# Patient Record
Sex: Female | Born: 1960 | Race: Black or African American | Hispanic: No | Marital: Married | Smoking: Current every day smoker
Health system: Southern US, Community
[De-identification: ages and names within clinical notes are randomized; demographics above are authoritative.]

## PROBLEM LIST (undated history)

## (undated) DIAGNOSIS — J45909 Unspecified asthma, uncomplicated: Secondary | ICD-10-CM

## (undated) DIAGNOSIS — D649 Anemia, unspecified: Secondary | ICD-10-CM

## (undated) DIAGNOSIS — F419 Anxiety disorder, unspecified: Secondary | ICD-10-CM

---

## 2015-10-18 ENCOUNTER — Encounter (HOSPITAL_COMMUNITY): Payer: Self-pay | Admitting: Emergency Medicine

## 2015-10-18 ENCOUNTER — Emergency Department (HOSPITAL_COMMUNITY): Payer: Self-pay

## 2015-10-18 ENCOUNTER — Emergency Department (HOSPITAL_COMMUNITY)
Admission: EM | Admit: 2015-10-18 | Discharge: 2015-10-19 | Disposition: A | Discharge status: Home / Self Care | Payer: Self-pay | Attending: Physician Assistant | Admitting: Physician Assistant

## 2015-10-18 DIAGNOSIS — Y9389 Activity, other specified: Secondary | ICD-10-CM | POA: Insufficient documentation

## 2015-10-18 DIAGNOSIS — S90511A Abrasion, right ankle, initial encounter: Secondary | ICD-10-CM | POA: Insufficient documentation

## 2015-10-18 DIAGNOSIS — Y999 Unspecified external cause status: Secondary | ICD-10-CM | POA: Insufficient documentation

## 2015-10-18 DIAGNOSIS — S199XXA Unspecified injury of neck, initial encounter: Secondary | ICD-10-CM | POA: Insufficient documentation

## 2015-10-18 DIAGNOSIS — S1980XA Other specified injuries of unspecified part of neck, initial encounter: Secondary | ICD-10-CM

## 2015-10-18 DIAGNOSIS — R937 Abnormal findings on diagnostic imaging of other parts of musculoskeletal system: Secondary | ICD-10-CM

## 2015-10-18 DIAGNOSIS — R51 Headache: Secondary | ICD-10-CM | POA: Insufficient documentation

## 2015-10-18 DIAGNOSIS — R52 Pain, unspecified: Secondary | ICD-10-CM

## 2015-10-18 DIAGNOSIS — M25531 Pain in right wrist: Secondary | ICD-10-CM | POA: Insufficient documentation

## 2015-10-18 DIAGNOSIS — M25521 Pain in right elbow: Secondary | ICD-10-CM | POA: Insufficient documentation

## 2015-10-18 DIAGNOSIS — J45909 Unspecified asthma, uncomplicated: Secondary | ICD-10-CM | POA: Insufficient documentation

## 2015-10-18 DIAGNOSIS — Y9289 Other specified places as the place of occurrence of the external cause: Secondary | ICD-10-CM | POA: Insufficient documentation

## 2015-10-18 DIAGNOSIS — D321 Benign neoplasm of spinal meninges: Secondary | ICD-10-CM | POA: Insufficient documentation

## 2015-10-18 DIAGNOSIS — F17210 Nicotine dependence, cigarettes, uncomplicated: Secondary | ICD-10-CM | POA: Insufficient documentation

## 2015-10-18 HISTORY — DX: Unspecified asthma, uncomplicated: J45.909

## 2015-10-18 HISTORY — DX: Anxiety disorder, unspecified: F41.9

## 2015-10-18 MED ORDER — OXYCODONE-ACETAMINOPHEN 5-325 MG PO TABS
2.0000 | ORAL_TABLET | Freq: Once | ORAL | Status: AC
  Administered 2015-10-18: 2 via ORAL
  Filled 2015-10-18: qty 2

## 2015-10-18 MED ORDER — TETANUS-DIPHTH-ACELL PERTUSSIS 5-2.5-18.5 LF-MCG/0.5 IM SUSP
0.5000 mL | Freq: Once | INTRAMUSCULAR | Status: DC
  Filled 2015-10-18: qty 0.5

## 2015-10-18 MED ORDER — OXYCODONE-ACETAMINOPHEN 5-325 MG PO TABS: 1.0000 | ORAL_TABLET | Freq: Four times a day (QID) | ORAL | 0 refills | Status: DC | PRN

## 2015-10-18 MED ORDER — OXYCODONE-ACETAMINOPHEN 5-325 MG PO TABS
1.0000 | ORAL_TABLET | Freq: Once | ORAL | Status: AC
  Administered 2015-10-18: 1 via ORAL
  Filled 2015-10-18: qty 1

## 2015-10-18 MED ORDER — SODIUM CHLORIDE 0.9 % IV BOLUS (SEPSIS)
1000.0000 mL | Freq: Once | INTRAVENOUS | Status: AC
  Administered 2015-10-18: 1000 mL via INTRAVENOUS

## 2015-10-18 MED ORDER — MORPHINE SULFATE (PF) 2 MG/ML IV SOLN
2.0000 mg | Freq: Once | INTRAVENOUS | Status: AC
  Administered 2015-10-18: 2 mg via INTRAVENOUS
  Filled 2015-10-18: qty 1

## 2015-10-18 MED ORDER — IOPAMIDOL (ISOVUE-370) INJECTION 76%
INTRAVENOUS | Status: AC
  Filled 2015-10-18: qty 50

## 2015-10-18 MED ORDER — GADOBENATE DIMEGLUMINE 529 MG/ML IV SOLN
14.0000 mL | Freq: Once | INTRAVENOUS | Status: AC | PRN
  Administered 2015-10-18: 14 mL via INTRAVENOUS

## 2015-10-18 NOTE — ED Notes (Signed)
Patient transported to MRI 
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
## 2015-10-18 NOTE — ED Notes (Addendum)
Patient states she "feels faint and feels like she's going to pass out."  Patient BP 73/50, EDP notified.
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
## 2015-10-18 NOTE — ED Notes (Signed)
Patient given crackers and water with ice, per Jerene Pitch, Therapist, sports.
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
## 2015-10-18 NOTE — ED Notes (Signed)
Pt given more crackers, per Jerene Pitch, Therapist, sports.

## 2015-10-18 NOTE — Progress Notes (Signed)
Orthopedic Tech Progress Note Patient Details:  Melody Rojas 1960-12-28 [id number]  Ortho Devices Type of Ortho Device: Sling immobilizer Ortho Device/Splint Location: RUE Ortho Device/Splint Interventions: Ordered, Application   Braulio Bosch 10/18/2015, 4:12 PM

## 2015-10-18 NOTE — ED Notes (Signed)
Pt back in room. No distress observed. 

## 2015-10-18 NOTE — Discharge Instructions (Signed)
You have a fracture in your right arm. Wear the sling at all times and follow up with the ortho physician given.
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
## 2015-10-18 NOTE — ED Notes (Signed)
MD at bedside. 
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
## 2015-10-18 NOTE — ED Notes (Signed)
Pt's BP 80/59.  Informed Brooke, Therapist, sports.
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
## 2015-11-04 ENCOUNTER — Other Ambulatory Visit: Payer: Self-pay | Admitting: Orthopedic Surgery

## 2015-11-04 ENCOUNTER — Other Ambulatory Visit (HOSPITAL_COMMUNITY): Payer: Self-pay | Admitting: Orthopedic Surgery

## 2015-11-04 DIAGNOSIS — M25511 Pain in right shoulder: Secondary | ICD-10-CM

## 2015-11-07 ENCOUNTER — Ambulatory Visit (HOSPITAL_BASED_OUTPATIENT_CLINIC_OR_DEPARTMENT_OTHER): Payer: Self-pay

## 2015-12-13 ENCOUNTER — Ambulatory Visit: Payer: Self-pay | Attending: Physical Therapy | Admitting: Physical Therapy

## 2015-12-22 ENCOUNTER — Ambulatory Visit (INDEPENDENT_AMBULATORY_CARE_PROVIDER_SITE_OTHER): Payer: Self-pay | Admitting: Orthopedic Surgery

## 2015-12-26 ENCOUNTER — Ambulatory Visit (INDEPENDENT_AMBULATORY_CARE_PROVIDER_SITE_OTHER): Payer: Self-pay | Admitting: Orthopedic Surgery

## 2015-12-26 DIAGNOSIS — S42291D Other displaced fracture of upper end of right humerus, subsequent encounter for fracture with routine healing: Secondary | ICD-10-CM

## 2015-12-29 ENCOUNTER — Ambulatory Visit: Payer: Self-pay | Admitting: Neurology

## 2016-01-10 ENCOUNTER — Telehealth (INDEPENDENT_AMBULATORY_CARE_PROVIDER_SITE_OTHER): Payer: Self-pay
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
## 2016-01-16 NOTE — Telephone Encounter (Signed)
Per Junie Panning we are going to discuss when pt comes in for her follow up appt.

## 2016-01-17 NOTE — Telephone Encounter (Signed)
Set reminder in chart for the date of appt here in office to discuss.

## 2016-01-23 ENCOUNTER — Ambulatory Visit (INDEPENDENT_AMBULATORY_CARE_PROVIDER_SITE_OTHER): Payer: Self-pay | Admitting: Orthopedic Surgery

## 2016-01-23 ENCOUNTER — Telehealth (INDEPENDENT_AMBULATORY_CARE_PROVIDER_SITE_OTHER): Payer: Self-pay

## 2016-01-30 NOTE — Telephone Encounter (Signed)
Will sign off on message as pt did not keep appt and does not return calls

## 2016-03-01 ENCOUNTER — Telehealth (INDEPENDENT_AMBULATORY_CARE_PROVIDER_SITE_OTHER): Payer: Self-pay
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
## 2016-07-18 ENCOUNTER — Emergency Department (HOSPITAL_COMMUNITY)
Admission: EM | Admit: 2016-07-18 | Discharge: 2016-07-19 | Disposition: A | Discharge status: Home / Self Care | Payer: Self-pay | Attending: Emergency Medicine | Admitting: Emergency Medicine

## 2016-07-18 ENCOUNTER — Encounter (HOSPITAL_COMMUNITY): Payer: Self-pay

## 2016-07-18 DIAGNOSIS — J45909 Unspecified asthma, uncomplicated: Secondary | ICD-10-CM | POA: Insufficient documentation

## 2016-07-18 DIAGNOSIS — F17210 Nicotine dependence, cigarettes, uncomplicated: Secondary | ICD-10-CM | POA: Insufficient documentation

## 2016-07-18 DIAGNOSIS — Z79899 Other long term (current) drug therapy: Secondary | ICD-10-CM | POA: Insufficient documentation

## 2016-07-18 DIAGNOSIS — K047 Periapical abscess without sinus: Secondary | ICD-10-CM | POA: Insufficient documentation

## 2016-07-18 HISTORY — DX: Anemia, unspecified: D64.9

## 2016-07-18 NOTE — ED Triage Notes (Signed)
Pt has dental abscess on upper L side of mouth, pt has several missing teeth and several broken teeth, swelling to L cheek, pt reports pain shoots up to L eye.

## 2016-07-19 LAB — BASIC METABOLIC PANEL
Anion gap: 8 (ref 5–15)
BUN: 10 mg/dL (ref 6–20)
CHLORIDE: 105 mmol/L (ref 101–111)
CO2: 24 mmol/L (ref 22–32)
CREATININE: 1.01 mg/dL — AB (ref 0.44–1.00)
Calcium: 8.3 mg/dL — ABNORMAL LOW (ref 8.9–10.3)
GFR calc non Af Amer: >60 mL/min (ref >60)
Glucose, Bld: 140 mg/dL — ABNORMAL HIGH (ref 65–99)
POTASSIUM: 3.2 mmol/L — AB (ref 3.5–5.1)
Sodium: 137 mmol/L (ref 135–145)

## 2016-07-19 LAB — CBC WITH DIFFERENTIAL/PLATELET
Basophils Absolute: 0 10*3/uL (ref 0.0–0.1)
Basophils Relative: 0 %
Eosinophils Absolute: 0.1 10*3/uL (ref 0.0–0.7)
Eosinophils Relative: 1 %
HEMATOCRIT: 36.6 % (ref 36.0–46.0)
HEMOGLOBIN: 11.5 g/dL — AB (ref 12.0–15.0)
LYMPHS ABS: 2.2 10*3/uL (ref 0.7–4.0)
LYMPHS PCT: 25 %
MCH: 26.9 pg (ref 26.0–34.0)
MCHC: 31.4 g/dL (ref 30.0–36.0)
MCV: 85.7 fL (ref 78.0–100.0)
MONOS PCT: 4 %
Monocytes Absolute: 0.4 10*3/uL (ref 0.1–1.0)
NEUTROS ABS: 6.2 10*3/uL (ref 1.7–7.7)
NEUTROS PCT: 70 %
Platelets: 160 10*3/uL (ref 150–400)
RBC: 4.27 MIL/uL (ref 3.87–5.11)
RDW: 12.8 % (ref 11.5–15.5)
WBC: 8.9 10*3/uL (ref 4.0–10.5)

## 2016-07-19 MED ORDER — CLINDAMYCIN HCL 300 MG PO CAPS: 300.0000 mg | ORAL_CAPSULE | Freq: Four times a day (QID) | ORAL | 0 refills | Status: AC

## 2016-07-19 MED ORDER — OXYCODONE-ACETAMINOPHEN 5-325 MG PO TABS: 1.0000 | ORAL_TABLET | Freq: Four times a day (QID) | ORAL | 0 refills | Status: AC | PRN

## 2016-07-19 MED ORDER — CLINDAMYCIN PHOSPHATE 600 MG/50ML IV SOLN
600.0000 mg | Freq: Once | INTRAVENOUS | Status: AC
  Administered 2016-07-19: 600 mg via INTRAVENOUS
  Filled 2016-07-19: qty 50

## 2016-07-19 MED ORDER — OXYCODONE-ACETAMINOPHEN 5-325 MG PO TABS
2.0000 | ORAL_TABLET | Freq: Once | ORAL | Status: AC
Start: 2016-07-19 — End: 2016-07-19
  Administered 2016-07-19: 2 via ORAL
  Filled 2016-07-19: qty 2

## 2016-07-19 NOTE — ED Provider Notes (Signed)
[name]   CSN: [id number] Arrival date & time: 07/18/16  2300  By signing my name below, I, [name], attest that this documentation has been prepared under the direction and in the presence of [name], MD. Electronically Signed: [name], ED Scribe. 07/19/16. 2:43 AM.   History   Chief Complaint Chief Complaint  Patient presents with  . Abscess    HPI Melody Rojas is a 56 y.o. female who presents to the Emergency Department complaining of moderate dental pain that started PTA. While waiting to be seen, pt had a syncopal episode. Pt has an abscess on the upper left side of mouth, several teeth missing, several broken teeth and swelling to her left cheek. She notes pain shoots up to her left eye. Pt denies fever or any other sx at this time.   The history is provided by the patient. No language interpreter was used.  Abscess  Location:  Mouth Mouth abscess location:  L inner cheek Red streaking: no   Progression:  Unchanged Associated symptoms: no fever     Past Medical History:  Diagnosis Date  . Anemia   . Anxiety   . Asthma     There are no active problems to display for this patient.   Past Surgical History:  Procedure Laterality Date  . CESAREAN SECTION      OB History    No data available       Home Medications    Prior to Admission medications   Medication Sig Start Date End Date Taking? Authorizing Provider  ibuprofen (ADVIL,MOTRIN) 400 MG tablet Take 400 mg by mouth every 6 (six) hours as needed.    Historical Provider, MD  oxyCODONE-acetaminophen (PERCOCET/ROXICET) 5-325 MG tablet Take 1 tablet by mouth every 6 (six) hours as needed for severe pain. 10/18/15   [name], MD    Family History No family history on file.  Social History Social History  Substance Use Topics  . Smoking status: Current Every Day Smoker    Packs/day: 0.50    Types: Cigarettes  . Smokeless tobacco:  Never Used  . Alcohol use No     Allergies   Aspirin and Bee venom   Review of Systems Review of Systems  Constitutional: Negative for fever.  HENT: Positive for dental problem and facial swelling.   Neurological: Positive for syncope.  All other systems reviewed and are negative.    Physical Exam Updated Vital Signs BP 110/66 (BP Location: Right Arm)   Pulse 78   Temp 99 F (37.2 C) (Oral)   Resp 16   Ht 5\' 7"  (1.702 m)   Wt 157 lb (71.2 kg)   SpO2 98%   BMI 24.59 kg/m   Physical Exam  Constitutional: She is oriented to person, place, and time. She appears well-developed and well-nourished. No distress.  HENT:  Head: Normocephalic and atraumatic.  Right Ear: Hearing normal.  Left Ear: Hearing normal.  Nose: Nose normal.  Mouth/Throat: Oropharynx is clear and moist and mucous membranes are normal.  There is swelling to the left cheek.   Eyes: Conjunctivae and EOM are normal. Pupils are equal, round, and reactive to light.  Neck: Normal range of motion. Neck supple.  Cardiovascular: Regular rhythm, S1 normal and S2 normal.  Exam reveals no gallop and no friction rub.   No murmur heard. Pulmonary/Chest: Effort normal and breath sounds normal. No respiratory distress. She exhibits no tenderness.  Abdominal: Soft. Normal appearance and bowel  sounds are normal. There is no hepatosplenomegaly. There is no tenderness. There is no rebound, no guarding, no tenderness at McBurney's point and negative Murphy's sign. No hernia.  Musculoskeletal: Normal range of motion.  Neurological: She is alert and oriented to person, place, and time. She has normal strength. No cranial nerve deficit or sensory deficit. Coordination normal. GCS eye subscore is 4. GCS verbal subscore is 5. GCS motor subscore is 6.  Skin: Skin is warm, dry and intact. No rash noted. No cyanosis.  Psychiatric: She has a normal mood and affect. Her speech is normal and behavior is normal. Thought content normal.    Nursing note and vitals reviewed.    ED Treatments / Results  DIAGNOSTIC STUDIES: Oxygen Saturation is 97% on RA, normal by my interpretation.   COORDINATION OF CARE: 2:37 AM-Discussed next steps with pt. Pt verbalized understanding and is agreeable with the plan.    Labs (all labs ordered are listed, but only abnormal results are displayed) Labs Reviewed  BASIC METABOLIC PANEL  CBC WITH DIFFERENTIAL/PLATELET    EKG  EKG Interpretation None       Radiology No results found.  Procedures Procedures (including critical care time)  Medications Ordered in ED Medications  clindamycin (CLEOCIN) IVPB 600 mg (600 mg Intravenous New Bag/Given 07/19/16 0239)     Initial Impression / Assessment and Plan / ED Course  I have reviewed the triage vital signs and the nursing notes.  Pertinent labs & imaging results that were available during my care of the patient were reviewed by me and considered in my medical decision making (see chart for details).  Patient is a 56 year old female who presents with facial swelling and dental pain. This is been ongoing for the past 2 days. She was triaged, then return to the waiting room due to a very busy department. While in the waiting room, she experienced some sort of syncopal episode and was immediately brought back for evaluation. Her blood pressure at that time was 60/40, however improved with IV fluids and stimulation. She woke and was completely coherent and only complaining of pain in her tooth and face.  She appears to have a dental abscess area she was given IV clindamycin as well as IV fluids. She will be discharged with clindamycin, pain medication, and is to follow-up with dentistry.  I suspect the etiology of her syncopal episode was vasovagal. Her laboratory studies are reassuring and blood pressures have since normalized and she now feels fine.  Final Clinical Impressions(s) / ED Diagnoses   Final diagnoses:  None    New  Prescriptions New Prescriptions   No medications on file   I personally performed the services described in this documentation, which was scribed in my presence. The recorded information has been reviewed and is accurate.       [name], MD 07/19/16 [phone]

## 2016-07-19 NOTE — Discharge Instructions (Signed)
Clindamycin as prescribed.  Percocet as prescribed as needed for pain.  Follow-up with dentistry. The resource guide for dental services has been provided in this discharge summary for you to call and make these arrangements.

## 2016-07-19 NOTE — ED Notes (Addendum)
Tech in waiting room getting vitals, pt stated that she felt dizzy and then went unresponsive from syncopal episode, pt responded to strong sternal rub, diaphoretic, incontinent of urine, BP 51/30, pt moved to room

## 2016-07-19 NOTE — ED Notes (Signed)
Patient was incontinent in waiting room chair

## 2016-08-16 ENCOUNTER — Emergency Department (HOSPITAL_COMMUNITY)
Admission: EM | Admit: 2016-08-16 | Discharge: 2016-08-16 | Disposition: A | Discharge status: Home / Self Care | Payer: Self-pay | Attending: Emergency Medicine | Admitting: Emergency Medicine

## 2016-08-16 ENCOUNTER — Encounter (HOSPITAL_COMMUNITY): Payer: Self-pay

## 2016-08-16 DIAGNOSIS — F17210 Nicotine dependence, cigarettes, uncomplicated: Secondary | ICD-10-CM | POA: Insufficient documentation

## 2016-08-16 DIAGNOSIS — T63443A Toxic effect of venom of bees, assault, initial encounter: Secondary | ICD-10-CM | POA: Insufficient documentation

## 2016-08-16 DIAGNOSIS — Z79899 Other long term (current) drug therapy: Secondary | ICD-10-CM | POA: Insufficient documentation

## 2016-08-16 DIAGNOSIS — J45909 Unspecified asthma, uncomplicated: Secondary | ICD-10-CM | POA: Insufficient documentation

## 2016-08-16 MED ORDER — DIPHENHYDRAMINE HCL 25 MG PO TABS: 25.0000 mg | ORAL_TABLET | Freq: Four times a day (QID) | ORAL | 0 refills | Status: AC

## 2016-08-16 MED ORDER — EPINEPHRINE 0.3 MG/0.3ML IJ SOAJ: 0.3000 mg | Freq: Once | INTRAMUSCULAR | 1 refills | Status: AC

## 2016-08-16 MED ORDER — DIPHENHYDRAMINE HCL 25 MG PO CAPS
50.0000 mg | ORAL_CAPSULE | Freq: Once | ORAL | Status: AC
  Administered 2016-08-16: 50 mg via ORAL
  Filled 2016-08-16: qty 2

## 2016-08-16 MED ORDER — PREDNISONE 20 MG PO TABS
60.0000 mg | ORAL_TABLET | Freq: Once | ORAL | Status: AC
  Administered 2016-08-16: 60 mg via ORAL
  Filled 2016-08-16: qty 3
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
## 2016-08-16 NOTE — Discharge Instructions (Signed)
Take the antihistamine as prescribed. I also wrote for refill of your  EpiPen. Consider seeing an allergist for formal allergy testing

## 2017-01-10 ENCOUNTER — Encounter (HOSPITAL_COMMUNITY): Payer: Self-pay

## 2017-01-10 ENCOUNTER — Emergency Department (HOSPITAL_COMMUNITY)
Admission: EM | Admit: 2017-01-10 | Discharge: 2017-01-10 | Disposition: A | Discharge status: Home / Self Care | Payer: Self-pay | Attending: Emergency Medicine | Admitting: Emergency Medicine

## 2017-01-10 DIAGNOSIS — Z79899 Other long term (current) drug therapy: Secondary | ICD-10-CM | POA: Insufficient documentation

## 2017-01-10 DIAGNOSIS — F17210 Nicotine dependence, cigarettes, uncomplicated: Secondary | ICD-10-CM | POA: Insufficient documentation

## 2017-01-10 DIAGNOSIS — G8929 Other chronic pain: Secondary | ICD-10-CM | POA: Insufficient documentation

## 2017-01-10 DIAGNOSIS — M5441 Lumbago with sciatica, right side: Secondary | ICD-10-CM | POA: Insufficient documentation

## 2017-01-10 DIAGNOSIS — J45909 Unspecified asthma, uncomplicated: Secondary | ICD-10-CM | POA: Insufficient documentation

## 2017-01-10 MED ORDER — METHOCARBAMOL 500 MG PO TABS: 500.0000 mg | ORAL_TABLET | Freq: Two times a day (BID) | ORAL | 0 refills | Status: AC

## 2017-01-10 MED ORDER — PREDNISONE 10 MG (21) PO TBPK: ORAL_TABLET | ORAL | 0 refills | Status: AC

## 2017-01-10 MED ORDER — LIDOCAINE 5 % EX PTCH: 1.0000 | MEDICATED_PATCH | CUTANEOUS | 0 refills | Status: AC

## 2017-01-10 NOTE — Discharge Instructions (Signed)
Take it easy, but do not lay around too much as this may make any stiffness worse.  Antiinflammatory medications: Take 600 mg of ibuprofen every 6 hours or 440 mg (over the counter dose) to 500 mg (prescription dose) of naproxen every 12 hours for the next 3 days. After this time, these medications may be used as needed for pain. Take these medications with food to avoid upset stomach. Choose only one of these medications, do not take them together.  Tylenol: Should you continue to have additional pain while taking the ibuprofen or naproxen, you may add in tylenol as needed. Your daily total maximum amount of tylenol from all sources should be limited to 4000mg /day for persons without liver problems, or 2000mg /day for those with liver problems. Muscle relaxer: Robaxin is a muscle relaxer and may help loosen stiff muscles. Do not take the Robaxin while driving or performing other dangerous activities. Do not take the Robaxin with any other muscle relaxers, such as flexeril. Lidocaine patches: These are available via either prescription or over-the-counter. The over-the-counter option may be more economical one and are likely just as effective. There are multiple over-the-counter brands, such as Salonpas. Prednisone: Take the prednisone, as prescribed, until finished. Exercises: Be sure to perform the attached exercises starting with three times a week and working up to performing them daily. This is an essential part of preventing long term problems.   Follow up with a primary care provider for any future management of these complaints.
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
## 2017-01-10 NOTE — ED Triage Notes (Signed)
Patient complains of ongoing lower back, hip and knee pain x 2 months since lifting patient at work, has been seen [facility], NAD
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
